# Patient Record
Sex: Female | Born: 1950 | Race: White | Hispanic: No | Marital: Married
Health system: Southern US, Community
[De-identification: ages and names within clinical notes are randomized; demographics above are authoritative.]

## PROBLEM LIST (undated history)

## (undated) DIAGNOSIS — E039 Hypothyroidism, unspecified: Secondary | ICD-10-CM

## (undated) DIAGNOSIS — G47419 Narcolepsy without cataplexy: Secondary | ICD-10-CM

## (undated) DIAGNOSIS — I471 Supraventricular tachycardia: Secondary | ICD-10-CM

## (undated) DIAGNOSIS — I10 Essential (primary) hypertension: Secondary | ICD-10-CM

## (undated) DIAGNOSIS — I4891 Unspecified atrial fibrillation: Secondary | ICD-10-CM

---

## 2015-10-22 ENCOUNTER — Encounter (HOSPITAL_COMMUNITY): Payer: Self-pay | Admitting: Emergency Medicine

## 2015-10-22 ENCOUNTER — Ambulatory Visit (HOSPITAL_COMMUNITY)
Admission: EM | Admit: 2015-10-22 | Discharge: 2015-10-22 | Disposition: A | Discharge status: Home / Self Care | Payer: Medicare Other | Attending: Family Medicine | Admitting: Family Medicine

## 2015-10-22 DIAGNOSIS — L235 Allergic contact dermatitis due to other chemical products: Secondary | ICD-10-CM | POA: Diagnosis not present

## 2015-10-22 HISTORY — DX: Supraventricular tachycardia: I47.1

## 2015-10-22 HISTORY — DX: Narcolepsy without cataplexy: G47.419

## 2015-10-22 HISTORY — DX: Hypothyroidism, unspecified: E03.9

## 2015-10-22 HISTORY — DX: Essential (primary) hypertension: I10

## 2015-10-22 HISTORY — DX: Unspecified atrial fibrillation: I48.91

## 2015-10-22 MED ORDER — TRIAMCINOLONE ACETONIDE 40 MG/ML IJ SUSP
40.0000 mg | Freq: Once | INTRAMUSCULAR | Status: AC
  Administered 2015-10-22: 40 mg via INTRAMUSCULAR

## 2015-10-22 MED ORDER — FLUTICASONE PROPIONATE 0.05 % EX CREA: TOPICAL_CREAM | Freq: Two times a day (BID) | CUTANEOUS | 1 refills | Status: AC

## 2015-10-22 MED ORDER — METHYLPREDNISOLONE ACETATE 40 MG/ML IJ SUSP
80.0000 mg | Freq: Once | INTRAMUSCULAR | Status: AC
  Administered 2015-10-22: 80 mg via INTRAMUSCULAR

## 2015-10-22 MED ORDER — PREDNISONE 50 MG PO TABS: ORAL_TABLET | ORAL | 0 refills | Status: DC

## 2015-10-22 MED ORDER — TRIAMCINOLONE ACETONIDE 40 MG/ML IJ SUSP
INTRAMUSCULAR | Status: AC
  Filled 2015-10-22: qty 1

## 2015-10-22 MED ORDER — METHYLPREDNISOLONE ACETATE 80 MG/ML IJ SUSP
INTRAMUSCULAR | Status: AC
  Filled 2015-10-22: qty 1

## 2015-10-22 NOTE — ED Provider Notes (Signed)
[facility]    CSN: [id number] Arrival date & time: 10/22/15  1348     History   Chief Complaint Chief Complaint  Patient presents with  . Fatigue  . Rash    HPI Valerie Gomez is a 65 y.o. female.   The history is provided by the patient.  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 Relieved by:  None tried Worsened by:  Nothing Ineffective treatments:  None tried Associated symptoms: fatigue and joint pain   Associated symptoms: no abdominal pain and no fever     Past Medical History:  Diagnosis Date  . A-fib (HCC)   . Hypertension   . Hypothyroidism   . Narcolepsy   . SVT (supraventricular tachycardia) (HCC)     There are no active problems to display for this patient.   No past surgical history on file.  OB History    No data available       Home Medications    Prior to Admission medications   Medication Sig Start Date End Date Taking? Authorizing Provider  amphetamine-dextroamphetamine (ADDERALL) 30 MG tablet Take 30 mg by mouth daily.   Yes Historical Provider, MD  cholecalciferol (VITAMIN D) 1000 units tablet Take 1,000 Units by mouth daily.   Yes Historical Provider, MD  levothyroxine (SYNTHROID, LEVOTHROID) 100 MCG tablet Take 100 mcg by mouth daily before breakfast.   Yes Historical Provider, MD  losartan (COZAAR) 25 MG tablet Take 30 mg by mouth daily.   Yes Historical Provider, MD  Multiple Vitamin (MULTIVITAMIN) tablet Take 1 tablet by mouth daily.   Yes Historical Provider, MD  vitamin B-12 (CYANOCOBALAMIN) 1000 MCG tablet Take 1,000 mcg by mouth daily.   Yes Historical Provider, MD    Family History No family history on file.  Social History Social History  Substance Use Topics  . Smoking status:  Not on file  . Smokeless tobacco: Not on file  . Alcohol use Not on file     Allergies   Review of patient's allergies indicates no known allergies.   Review of Systems Review of Systems  Constitutional: Positive for fatigue. Negative for fever.  Gastrointestinal: Negative for abdominal pain.  Musculoskeletal: Positive for arthralgias.  Skin: Positive for rash.     Physical Exam Triage Vital Signs ED Triage Vitals [10/22/15 1408]  Enc Vitals Group     BP 160/59     Pulse Rate 83     Resp 16     Temp 98.5 F (36.9 C)     Temp Source Oral     SpO2 100 %     Weight      Height      Head Circumference      Peak Flow      Pain Score      Pain Loc      Pain Edu?      Excl. in GC?    No data found.   Updated Vital Signs BP 160/59 (BP Location: Left Arm)   Pulse 83   Temp 98.5 F (36.9 C) (Oral)   Resp 16   SpO2 100%   Visual Acuity Right Eye Distance:   Left Eye Distance:   Bilateral Distance:    Right Eye Near:  Left Eye Near:    Bilateral Near:     Physical Exam  Constitutional: She appears well-developed and well-nourished. No distress.  Cardiovascular: Normal rate, regular rhythm, normal heart sounds and intact distal pulses.   Pulmonary/Chest: Effort normal and breath sounds normal.  Skin: Skin is warm and dry.  Peeling cracking hand dermatitis bilat., no signs of infection.  Nursing note and vitals reviewed.    UC Treatments / Results  Labs (all labs ordered are listed, but only abnormal results are displayed) Labs Reviewed - No data to display  EKG  EKG Interpretation None       Radiology No results found.  Procedures Procedures (including critical care time)  Medications Ordered in UC Medications  methylPREDNISolone acetate (DEPO-MEDROL) injection 80 mg (not administered)  triamcinolone acetonide (KENALOG-40) injection 40 mg (not administered)     Initial Impression / Assessment and Plan / UC Course  I have reviewed the  triage vital signs and the nursing notes.  Pertinent labs & imaging results that were available during my care of the patient were reviewed by me and considered in my medical decision making (see chart for details).  Clinical Course      Final Clinical Impressions(s) / UC Diagnoses   Final diagnoses:  None    New Prescriptions New Prescriptions   No medications on file     Linna Hoff, MD 10/22/15 1525

## 2015-10-22 NOTE — ED Triage Notes (Signed)
Patient has multiple concerns.  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  Patient has chronic issues with her hands being dry, cracked, bleeding and burning pain.  Patient has concerns for feeling tired, legs heavy and feeling slightly different in breathing.  Feels a little harder to breathe, but not sob.  talking in complete sentences.

## 2015-11-15 ENCOUNTER — Emergency Department (HOSPITAL_COMMUNITY): Payer: Medicare Other

## 2015-11-15 ENCOUNTER — Encounter (HOSPITAL_COMMUNITY): Payer: Self-pay

## 2015-11-15 ENCOUNTER — Emergency Department (HOSPITAL_COMMUNITY)
Admission: EM | Admit: 2015-11-15 | Discharge: 2015-11-15 | Disposition: A | Discharge status: Home / Self Care | Payer: Medicare Other | Attending: Emergency Medicine | Admitting: Emergency Medicine

## 2015-11-15 DIAGNOSIS — I10 Essential (primary) hypertension: Secondary | ICD-10-CM | POA: Insufficient documentation

## 2015-11-15 DIAGNOSIS — R079 Chest pain, unspecified: Secondary | ICD-10-CM | POA: Insufficient documentation

## 2015-11-15 DIAGNOSIS — R1084 Generalized abdominal pain: Secondary | ICD-10-CM | POA: Insufficient documentation

## 2015-11-15 DIAGNOSIS — R1030 Lower abdominal pain, unspecified: Secondary | ICD-10-CM | POA: Diagnosis present

## 2015-11-15 DIAGNOSIS — Z79899 Other long term (current) drug therapy: Secondary | ICD-10-CM | POA: Diagnosis not present

## 2015-11-15 DIAGNOSIS — E039 Hypothyroidism, unspecified: Secondary | ICD-10-CM | POA: Insufficient documentation

## 2015-11-15 LAB — LIPASE, BLOOD: Lipase: 34 U/L (ref 11–51)

## 2015-11-15 LAB — CBC
HCT: 41 % (ref 36.0–46.0)
HEMOGLOBIN: 13.6 g/dL (ref 12.0–15.0)
MCH: 31.3 pg (ref 26.0–34.0)
MCHC: 33.2 g/dL (ref 30.0–36.0)
MCV: 94.3 fL (ref 78.0–100.0)
Platelets: 272 10*3/uL (ref 150–400)
RBC: 4.35 MIL/uL (ref 3.87–5.11)
RDW: 13.6 % (ref 11.5–15.5)
WBC: 7.7 10*3/uL (ref 4.0–10.5)

## 2015-11-15 LAB — COMPREHENSIVE METABOLIC PANEL
ALK PHOS: 89 U/L (ref 38–126)
ALT: 23 U/L (ref 14–54)
ANION GAP: 9 (ref 5–15)
AST: 26 U/L (ref 15–41)
Albumin: 3.3 g/dL — ABNORMAL LOW (ref 3.5–5.0)
BILIRUBIN TOTAL: 0.5 mg/dL (ref 0.3–1.2)
BUN: 17 mg/dL (ref 6–20)
CALCIUM: 8.7 mg/dL — AB (ref 8.9–10.3)
CO2: 27 mmol/L (ref 22–32)
Chloride: 102 mmol/L (ref 101–111)
Creatinine, Ser: 1.06 mg/dL — ABNORMAL HIGH (ref 0.44–1.00)
GFR calc non Af Amer: 54 mL/min — ABNORMAL LOW (ref >60)
Glucose, Bld: 103 mg/dL — ABNORMAL HIGH (ref 65–99)
POTASSIUM: 3.7 mmol/L (ref 3.5–5.1)
Sodium: 138 mmol/L (ref 135–145)
TOTAL PROTEIN: 6.5 g/dL (ref 6.5–8.1)

## 2015-11-15 LAB — I-STAT TROPONIN, ED: TROPONIN I, POC: 0 ng/mL (ref 0.00–0.08)

## 2015-11-15 MED ORDER — DICYCLOMINE HCL 10 MG PO CAPS
10.0000 mg | ORAL_CAPSULE | Freq: Once | ORAL | Status: AC
  Administered 2015-11-15: 10 mg via ORAL
  Filled 2015-11-15: qty 1

## 2015-11-15 MED ORDER — DICYCLOMINE HCL 20 MG PO TABS: 20.0000 mg | ORAL_TABLET | Freq: Two times a day (BID) | ORAL | 0 refills | Status: AC

## 2015-11-15 MED ORDER — IOPAMIDOL (ISOVUE-300) INJECTION 61%
INTRAVENOUS | Status: AC
  Administered 2015-11-15: 100 mL
  Filled 2015-11-15: qty 100

## 2015-11-15 MED ORDER — SUCRALFATE 1 G PO TABS: 1.0000 g | ORAL_TABLET | Freq: Three times a day (TID) | ORAL | 0 refills | Status: AC

## 2015-11-15 MED ORDER — PANTOPRAZOLE SODIUM 20 MG PO TBEC: 20.0000 mg | DELAYED_RELEASE_TABLET | Freq: Every day | ORAL | 1 refills | Status: AC

## 2015-11-15 MED ORDER — ONDANSETRON HCL 4 MG/2ML IJ SOLN
4.0000 mg | Freq: Once | INTRAMUSCULAR | Status: AC
  Administered 2015-11-15: 4 mg via INTRAVENOUS
  Filled 2015-11-15: qty 2

## 2015-11-15 NOTE — ED Notes (Signed)
Nurse drawing labs. 

## 2015-11-15 NOTE — Discharge Instructions (Signed)
Try bentyl for your abdominal cramping. Take your zofran for nausea and vomiting. Drink plenty of fluids. Follow up as needed. Return if worsening symptoms.

## 2015-11-15 NOTE — ED Provider Notes (Signed)
[facility] Note   CSN: [id number] Arrival date & time: 11/15/15  1706     History   Chief Complaint Chief Complaint  Patient presents with  . Abdominal Pain  . Chest Pain    HPI Valerie KalesLois Tabak is a 65 y.o. female.  HPI Valerie Gomez is a 65 y.o. female with history of hypertension, SVT, A. fib, hypothyroidism, presents to emergency department complaining of abdominal pain, nausea. She states pain is all over her abdomen, starts in the suprapubic area, radiates up into the chest. Denies any associated shortness of breath. States pain is "burning and squeezing." She has not tried any medications prior to coming in. She states she has not had a bowel movement today, but states "had 10 yesterday and they were solid." Denies any blood in her stool. No emesis. States eating and drinking making pain worse. Nothing making it better. Pt is from [place], here for a job. States "I have chronic abdominal issues, but this time it is much worse." Patient apparently had a workup for abdominal pain approximately a month ago, at that time they saw a suspicious mass in her kidney, patient was supposed to do a MRI, but unable to go through due to a pacemaker.  Past Medical History:  Diagnosis Date  . A-fib (HCC)   . Hypertension   . Hypothyroidism   . Narcolepsy   . SVT (supraventricular tachycardia) (HCC)     There are no active problems to display for this patient.   History reviewed. No pertinent surgical history.  OB History    No data available       Home Medications    Prior to Admission medications   Medication Sig Start Date End Date Taking? Authorizing Provider  amphetamine-dextroamphetamine (ADDERALL) 30 MG tablet Take 30 mg by mouth daily.    Historical Provider, MD  cholecalciferol (VITAMIN D) 1000 units tablet Take 1,000 Units by mouth daily.    Historical Provider, MD  fluticasone (CUTIVATE) 0.05 % cream Apply topically 2 (two) times daily. 10/22/15   Linna HoffJames D Kindl,  MD  levothyroxine (SYNTHROID, LEVOTHROID) 100 MCG tablet Take 100 mcg by mouth daily before breakfast.    Historical Provider, MD  losartan (COZAAR) 25 MG tablet Take 30 mg by mouth daily.    Historical Provider, MD  Multiple Vitamin (MULTIVITAMIN) tablet Take 1 tablet by mouth daily.    Historical Provider, MD  predniSONE (DELTASONE) 50 MG tablet 1 tab daily for 2 days then 1/2 tab daily for 2 days. 10/22/15   Linna HoffJames D Kindl, MD  vitamin B-12 (CYANOCOBALAMIN) 1000 MCG tablet Take 1,000 mcg by mouth daily.    Historical Provider, MD    Family History No family history on file.  Social History Social History  Substance Use Topics  . Smoking status: Not on file  . Smokeless tobacco: Not on file  . Alcohol use Not on file     Allergies   Patient has no known allergies.   Review of Systems Review of Systems  Constitutional: Negative for chills and fever.  Respiratory: Negative for cough, chest tightness and shortness of breath.   Cardiovascular: Positive for chest pain. Negative for palpitations and leg swelling.  Gastrointestinal: Positive for abdominal pain and nausea. Negative for blood in stool, diarrhea and vomiting.  Genitourinary: Negative for dysuria, flank pain and pelvic pain.  Musculoskeletal: Negative for arthralgias, myalgias, neck pain and neck stiffness.  Skin: Negative for rash.  Neurological: Negative for dizziness, weakness and headaches.  All other systems reviewed and are negative.    Physical Exam Updated Vital Signs There were no vitals taken for this visit.  Physical Exam  Constitutional: She appears well-developed and well-nourished. No distress.  HENT:  Head: Normocephalic.  Eyes: Conjunctivae are normal.  Neck: Neck supple.  Cardiovascular: Normal rate, regular rhythm and normal heart sounds.   Pulmonary/Chest: Effort normal and breath sounds normal. No respiratory distress. She has no wheezes. She has no rales.  Abdominal: Soft. Bowel sounds are  normal. She exhibits no distension. There is tenderness. There is no rebound.  Diffuse tenderness, no guarding. No CVA tenderness bilaterally  Musculoskeletal: She exhibits no edema.  Neurological: She is alert.  Skin: Skin is warm and dry.  Psychiatric: She has a normal mood and affect. Her behavior is normal.  Nursing note and vitals reviewed.    ED Treatments / Results  Labs (all labs ordered are listed, but only abnormal results are displayed) Labs Reviewed  COMPREHENSIVE METABOLIC PANEL - Abnormal; Notable for the following:       Result Value   Glucose, Bld 103 (*)    Creatinine, Ser 1.06 (*)    Calcium 8.7 (*)    Albumin 3.3 (*)    GFR calc non Af Amer 54 (*)    All other components within normal limits  LIPASE, BLOOD  CBC  I-STAT TROPOININ, ED    EKG  EKG Interpretation  Date/Time:  Sunday November 15 2015 17:15:09 EST Ventricular Rate:  90 PR Interval:  144 QRS Duration: 68 QT Interval:  344 QTC Calculation: 420 R Axis:   89 Text Interpretation:  Normal sinus rhythm Septal infarct , age undetermined Abnormal ECG Confirmed by COOK  MD, BRIAN ([id number]) on 11/15/2015 5:28:01 PM       Radiology Dg Chest 2 View  Result Date: 11/15/2015 CLINICAL DATA:  Epigastric pain radiating to the chest. EXAM: CHEST  2 VIEW COMPARISON:  None. FINDINGS: Cardial pacemaker is in place. Cardiomediastinal silhouette is normal. Mediastinal contours appear intact. There is no evidence of focal airspace consolidation, pleural effusion or pneumothorax. Osseous structures are without acute abnormality. Soft tissues are grossly normal. IMPRESSION: No active cardiopulmonary disease. Electronically Signed   By: Ted Mcalpine M.D.   On: 11/15/2015 18:35   Ct Abdomen Pelvis W Contrast  Result Date: 11/15/2015 CLINICAL DATA:  Abdominal pain for 3 days EXAM: CT ABDOMEN AND PELVIS WITH CONTRAST TECHNIQUE: Multidetector CT imaging of the abdomen and pelvis was performed using the standard  protocol following bolus administration of intravenous contrast. CONTRAST:  ISOVUE-300 IOPAMIDOL (ISOVUE-300) INJECTION 61% COMPARISON:  None. FINDINGS: Lower chest: No acute abnormality. Hepatobiliary: No focal liver abnormality is seen. Status post cholecystectomy. No biliary dilatation. Pancreas: Unremarkable. No pancreatic ductal dilatation or surrounding inflammatory changes. Spleen: Normal in size without focal abnormality. Adrenals/Urinary Tract: Normal adrenal glands. Normal left kidney. 15 mm hypodense, fluid attenuating right renal mass. No obstructive uropathy. Normal bladder. Stomach/Bowel: Prior gastric bypass. No evidence of bowel wall thickening, distention, or inflammatory changes. Vascular/Lymphatic: Abdominal aorta is normal in caliber. Mild abdominal aortic atherosclerosis. No lymphadenopathy. Reproductive: Status post hysterectomy. No adnexal masses. Other: No abdominal wall hernia or abnormality. No abdominopelvic ascites. Musculoskeletal: No acute osseous abnormality. 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 IMPRESSION: 1. No acute abdominal or pelvic pathology. Electronically Signed   By: Elige Ko  On: 11/15/2015 20:42    Procedures Procedures (including critical care time)  Medications Ordered in ED Medications  dicyclomine (BENTYL) capsule 10 mg (not administered)  ondansetron (ZOFRAN) injection 4 mg (4 mg Intravenous Given 11/15/15 1851)  iopamidol (ISOVUE-300) 61 % injection (100 mLs  Contrast Given 11/15/15 2011)     Initial Impression / Assessment and Plan / ED Course  I have reviewed the triage vital signs and the nursing notes.  Pertinent labs & imaging results that were available during my care of the patient were reviewed by me and considered in my medical decision making (see  chart for details).  Clinical Course     5:51 PM Patient seen and examined. Patient is in the emergency department with abdominal pain radiating to the chest with associated nausea. Pain is worsened with eating. Multiple abdominal surgeries in the past including gastric bypass. Patient is from out of town. Will get labs, CT abdomen and pelvis, will try Zofran for nausea. No urinary symptoms.   9:06 PM The blood work unremarkable. CT abdomen and pelvis negative. Chest x-ray troponin negative. Normal white blood cell count. Abdominal exam reassessed, abdomen is diffusely mildly tender with no guarding or rebound tenderness. Plan to discharge home with Bentyl. Patient is not vomiting. Vital signs are normal. Stable for discharge home.  Vitals:   11/15/15 1900 11/15/15 1913 11/15/15 2000 11/15/15 2045  BP: (!) 140/53  115/56 138/70  Pulse: 70  65 65  Resp: 12  19 22   SpO2: 100%  97% 100%  Weight:  117.9 kg    Height:  5\' 4"  (1.626 m)      Final Clinical Impressions(s) / ED Diagnoses   Final diagnoses:  Generalized abdominal pain    New Prescriptions New Prescriptions   DICYCLOMINE (BENTYL) 20 MG TABLET    Take 1 tablet (20 mg total) by mouth 2 (two) times daily.     Jaynie Crumbleatyana Carisma Troupe, PA-C 11/15/15 2108    Donnetta HutchingBrian Cook, MD 11/20/15 [phone]

## 2015-11-15 NOTE — ED Notes (Signed)
Pt to CT

## 2015-11-15 NOTE — ED Triage Notes (Signed)
Patient complains of epigastric pain with radiation to chest since Thursday. Describes the pain as a burning /squeezing pain. Patient states that she thinks may be reflux but wants to be sure. Patient in no distress. Alert and oriented

## 2016-12-03 DEATH — deceased

## 2018-02-14 IMAGING — CT CT ABD-PELV W/ CM
2 of 5 series · 17 of 46 positions shown, 19 images · IV contrast (Omni 300)
Comparison: None.

CLINICAL DATA: Abdominal pain for 3 days

EXAM:
CT ABDOMEN AND PELVIS WITH CONTRAST
TECHNIQUE: Multidetector CT imaging of the abdomen and pelvis was performed
using the standard protocol following bolus administration of
intravenous contrast.
CONTRAST:  100mL 8ZBTSI-8BB IOPAMIDOL (8ZBTSI-8BB) INJECTION 61%

[Series 2: a/p w/ 5mm (person_name) · axial · 0.98mm/px · z∈[+855,+1280]mm · 14 of 99 slices shown, 16 images]
[im 7/99  soft-tissue]
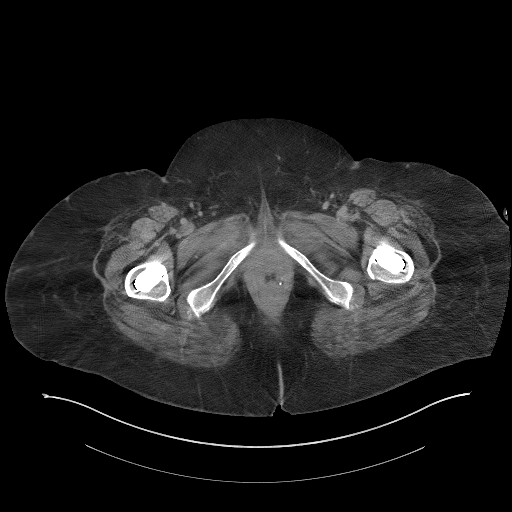
[im 7/99  bone]
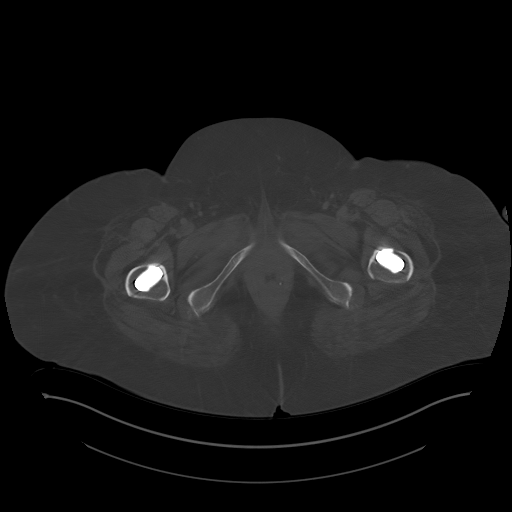
[im 13/99  soft-tissue]
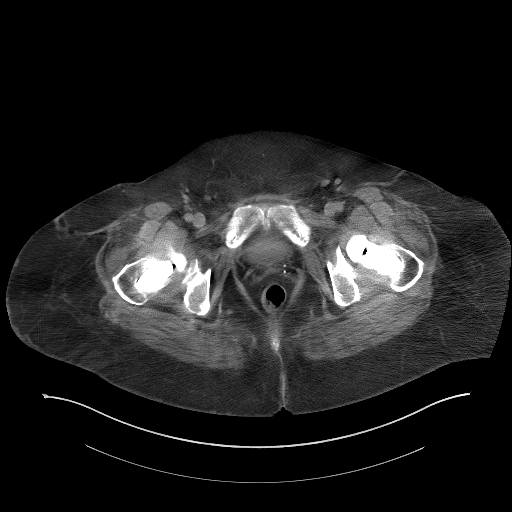
[im 19/99  soft-tissue]
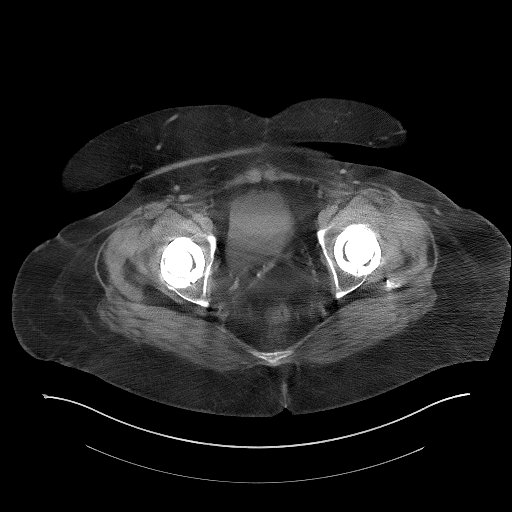
[im 25/99  soft-tissue]
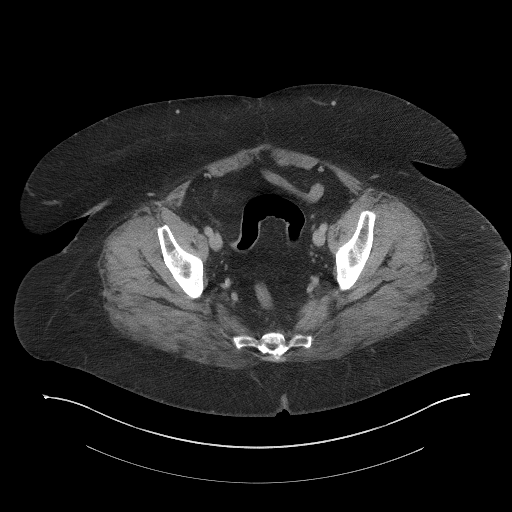
[im 31/99  soft-tissue]
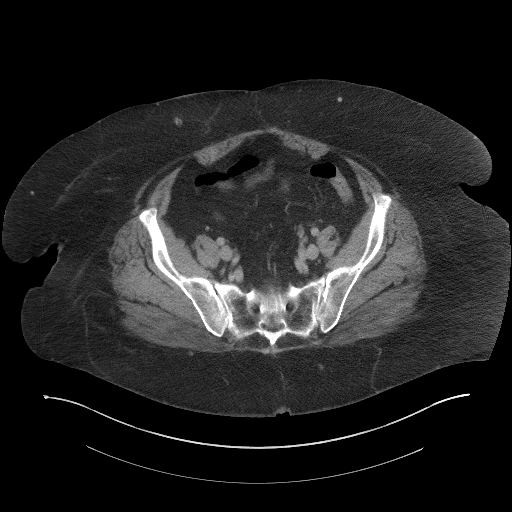
[im 37/99  soft-tissue]
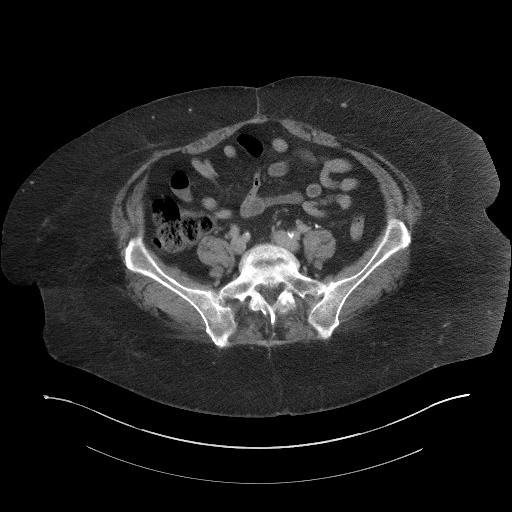
[im 43/99  soft-tissue]
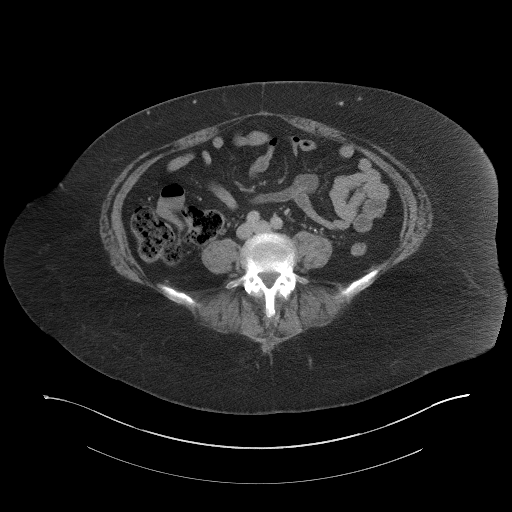
[im 56/99  soft-tissue]
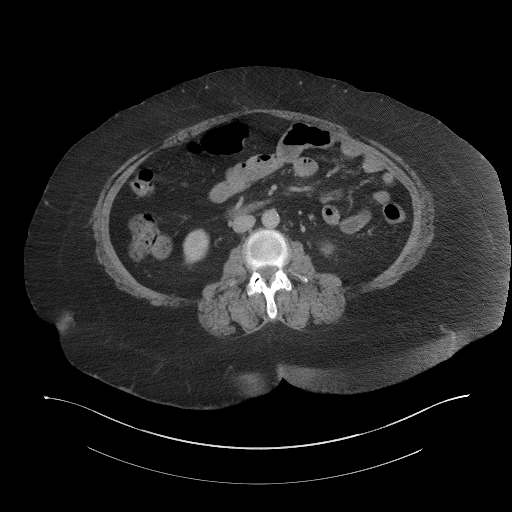
[im 62/99  soft-tissue]
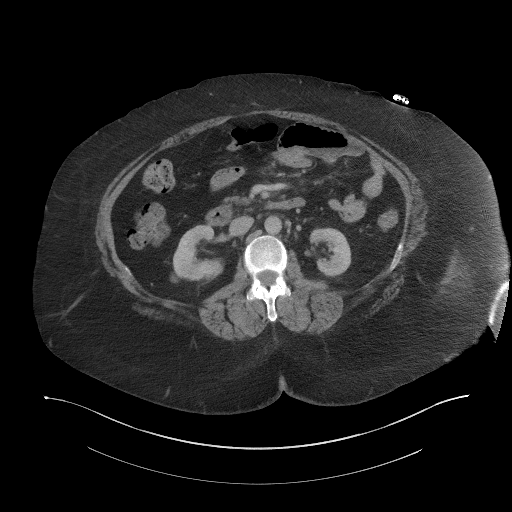
[im 62/99  bone]
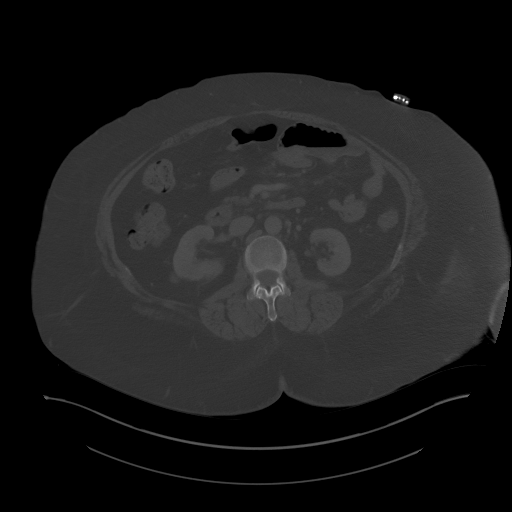
[im 68/99  soft-tissue]
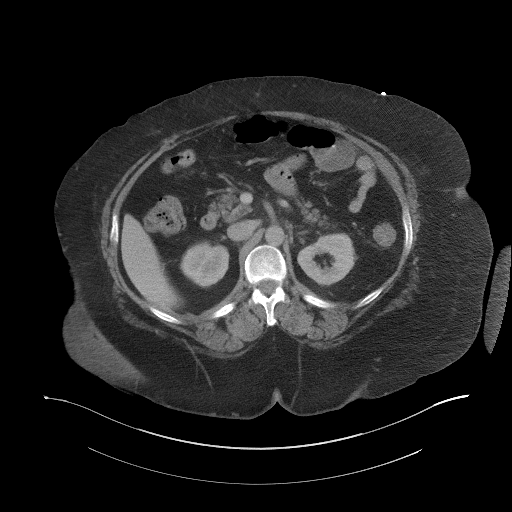
[im 74/99  soft-tissue]
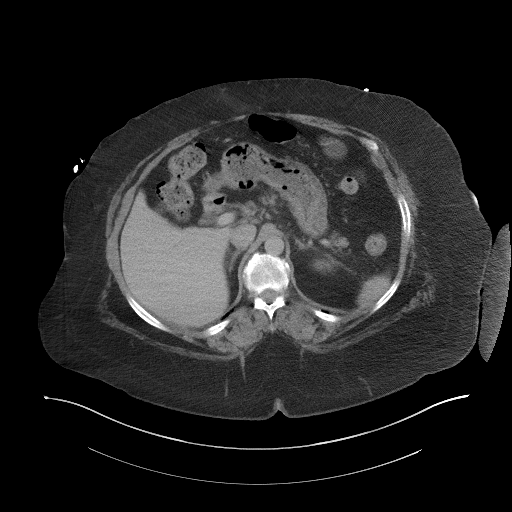
[im 80/99  soft-tissue]
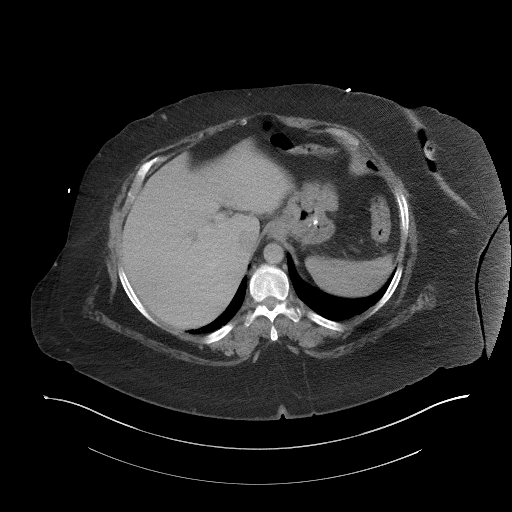
[im 86/99  soft-tissue]
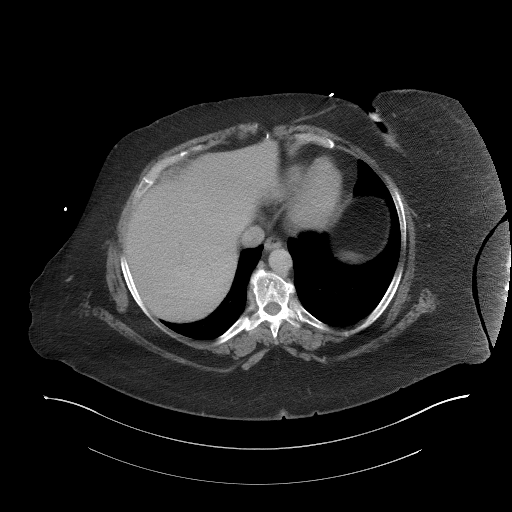
[im 92/99  soft-tissue]
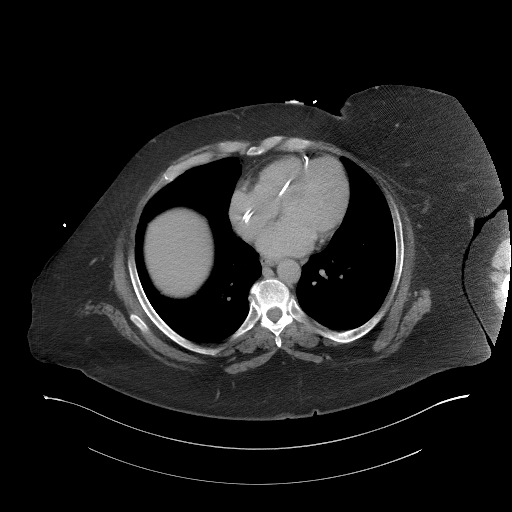

[Series 6: a/p w/ cor · coronal · 0.96mm/px · 3 of 139 slices shown]
[im 47/139  soft-tissue]
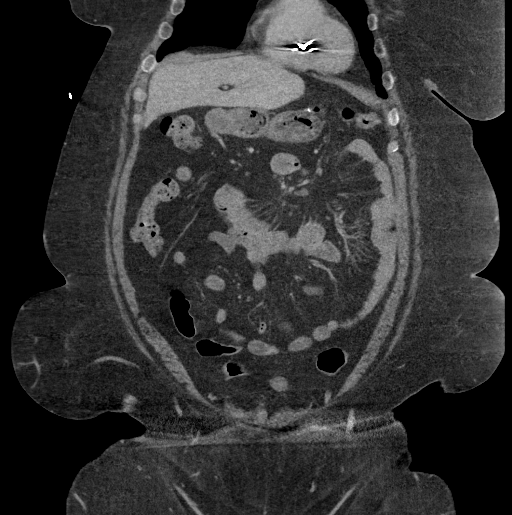
[im 62/139  soft-tissue]
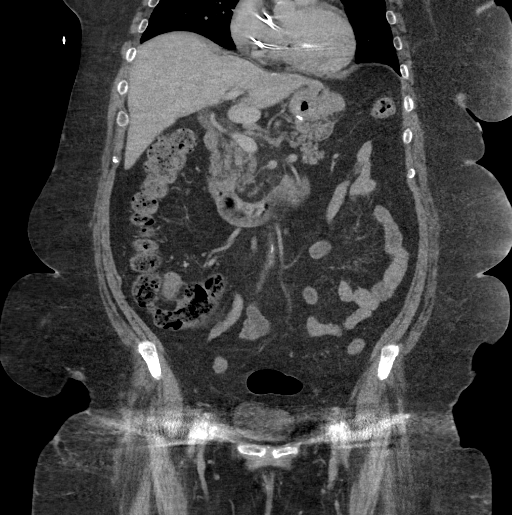
[im 77/139  soft-tissue]
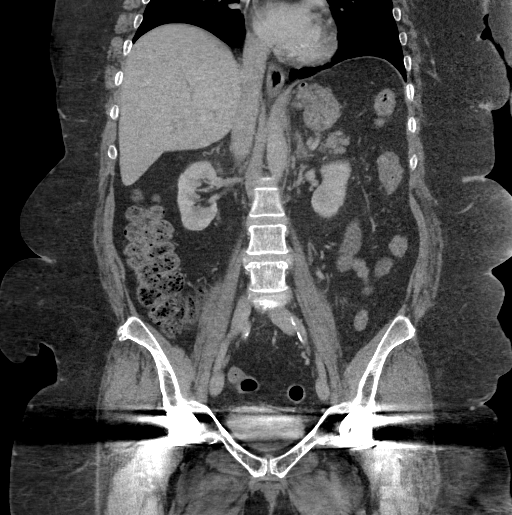

[17 of 46 positions shown; findings below may reference images not displayed]

FINDINGS: Lower chest: No acute abnormality.

Hepatobiliary: No focal liver abnormality is seen. Status post
cholecystectomy. No biliary dilatation.

Pancreas: Unremarkable. No pancreatic ductal dilatation or
surrounding inflammatory changes.

Spleen: Normal in size without focal abnormality.

Adrenals/Urinary Tract: Normal adrenal glands. Normal left kidney.
15 mm hypodense, fluid attenuating right renal mass. No obstructive
uropathy. Normal bladder.

Stomach/Bowel: Prior gastric bypass. No evidence of bowel wall
thickening, distention, or inflammatory changes.

Vascular/Lymphatic: Abdominal aorta is normal in caliber. Mild
abdominal aortic atherosclerosis. No lymphadenopathy.

Reproductive: Status post hysterectomy. No adnexal masses.

Other: No abdominal wall hernia or abnormality. No abdominopelvic
ascites.

Musculoskeletal: No acute osseous abnormality. Bilateral total hip
arthroplasties with beam hardening artifact partially obscuring the
adjacent soft tissue and osseous structures. Degenerative disc
disease with disc height loss at L5-S1 with bilateral facet
arthropathy.
IMPRESSION: 1. No acute abdominal or pelvic pathology.
